# Patient Record
Sex: Male | Born: 1960 | Race: White | Hispanic: No | Marital: Married | Smoking: Never smoker
Health system: Southern US, Community
[De-identification: ages and names within clinical notes are randomized; demographics above are authoritative.]

## PROBLEM LIST (undated history)

## (undated) HISTORY — PX: TOTAL HIP ARTHROPLASTY: SHX124

---
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
## 2000-07-10 HISTORY — PX: ROTATOR CUFF REPAIR: SHX139

## 2016-12-13 ENCOUNTER — Encounter: Payer: Self-pay | Admitting: Family Medicine

## 2016-12-13 ENCOUNTER — Ambulatory Visit (INDEPENDENT_AMBULATORY_CARE_PROVIDER_SITE_OTHER): Payer: Managed Care, Other (non HMO) | Admitting: Family Medicine

## 2016-12-13 VITALS — BP 119/54 | HR 54 | Ht 67.32 in | Wt 203.0 lb

## 2016-12-13 DIAGNOSIS — M160 Bilateral primary osteoarthritis of hip: Secondary | ICD-10-CM

## 2016-12-13 DIAGNOSIS — Z0000 Encounter for general adult medical examination without abnormal findings: Secondary | ICD-10-CM | POA: Diagnosis not present

## 2016-12-13 LAB — COMPLETE METABOLIC PANEL WITH GFR
ALBUMIN: 4.5 g/dL (ref 3.6–5.1)
ALK PHOS: 72 U/L (ref 40–115)
ALT: 22 U/L (ref 9–46)
AST: 20 U/L (ref 10–35)
BUN: 16 mg/dL (ref 7–25)
CO2: 26 mmol/L (ref 20–31)
Calcium: 9.2 mg/dL (ref 8.6–10.3)
Chloride: 106 mmol/L (ref 98–110)
Creat: 1 mg/dL (ref 0.70–1.33)
GFR, Est African American: >89 mL/min (ref >=60)
GFR, Est Non African American: 83 mL/min (ref >=60)
GLUCOSE: 92 mg/dL (ref 65–99)
POTASSIUM: 4.6 mmol/L (ref 3.5–5.3)
SODIUM: 140 mmol/L (ref 135–146)
TOTAL PROTEIN: 6.6 g/dL (ref 6.1–8.1)
Total Bilirubin: 0.9 mg/dL (ref 0.2–1.2)

## 2016-12-13 LAB — LIPID PANEL W/REFLEX DIRECT LDL
CHOL/HDL RATIO: 3.6 ratio (ref <5.0)
Cholesterol: 204 mg/dL — ABNORMAL HIGH (ref <200)
HDL: 57 mg/dL (ref >40)
LDL-CHOLESTEROL: 127 mg/dL — AB
Non-HDL Cholesterol (Calc): 147 mg/dL — ABNORMAL HIGH (ref <130)
Triglycerides: 96 mg/dL (ref <150)

## 2016-12-13 NOTE — Patient Instructions (Signed)
Keep up a regular exercise program and make sure you are eating a healthy diet Try to eat 4 servings of dairy a day, or if you are lactose intolerant take a calcium with vitamin D daily.  Your vaccines are up to date.   

## 2016-12-13 NOTE — Progress Notes (Addendum)
Subjective:    Patient ID: Patrick Young, male    DOB: 1960/07/16, 56 y.o.   MRN: [id number]  HPI 56 year old male comes in today to establish care. I see his wife. His was previously being followed at [facility] for approximately the last 20 years. His physician recently is no longer practicing primary care. He does have a couple of concerns. He has bilateral hip osteoarthritis. 10 years ago they had recommended possible hip replacement had encouraged him to push it off until he really needed it. He would like to get an update on where he is at in that regards. He's now having pain that Radiates down into his upper thighs.   Review of Systems  Constitutional: Negative for diaphoresis, fever and unexpected weight change.  HENT: Negative for hearing loss, rhinorrhea, sneezing and tinnitus.   Eyes: Negative for visual disturbance.  Respiratory: Negative for cough and wheezing.   Cardiovascular: Negative for chest pain and palpitations.  Gastrointestinal: Negative for blood in stool, diarrhea, nausea and vomiting.  Genitourinary: Negative for discharge and dysuria.  Musculoskeletal: Negative for arthralgias and myalgias.  Skin: Negative for rash.  Neurological: Negative for headaches.  Hematological: Negative for adenopathy.  Psychiatric/Behavioral: Negative for dysphoric mood and sleep disturbance. The patient is not nervous/anxious.     BP (!) 119/54   Pulse (!) 54   Ht 5' 7.32" (1.71 m)   Wt 203 lb (92.1 kg)   SpO2 100%   BMI 31.49 kg/m     Allergies  Allergen Reactions  . Penicillins     No past medical history on file.  Past Surgical History:  Procedure Laterality Date  . KNEE ARTHROSCOPY Left 1977  . KNEE ARTHROSCOPY WITH ANTERIOR CRUCIATE LIGAMENT (ACL) REPAIR Left 1980  . ROTATOR CUFF REPAIR Right 2002    Social History   Social History  . Marital status: Married    Spouse name: Gavin PoundDeborah  . Number of children: 2  . Years of education: MBA    Occupational History  . IT     [employer]    Social History Main Topics  . Smoking status: Never Smoker  . Smokeless tobacco: Never Used  . Alcohol use No  . Drug use: No  . Sexual activity: Yes    Partners: Female   Other Topics Concern  . Not on file   Social History Narrative   2 caffeine drinks daily. Works out doing [employer] Companyweights and cardio for 60 minutes 4 times a week.    Family History  Problem Relation Age of Onset  . Hypertension Mother   . Prostate cancer Father        late 1460s  . Lung cancer Father        early 3260s    No outpatient encounter prescriptions on file as of 12/13/2016.   No facility-administered encounter medications on file as of 12/13/2016.          Objective:   Physical Exam  Constitutional: He is oriented to person, place, and time. He appears well-developed and well-nourished.  HENT:  Head: Normocephalic and atraumatic.  Right Ear: External ear normal.  Left Ear: External ear normal.  Nose: Nose normal.  Mouth/Throat: Oropharynx is clear and moist.  Eyes: Conjunctivae and EOM are normal. Pupils are equal, round, and reactive to light.  Neck: Normal range of motion. Neck supple. No thyromegaly present.  Cardiovascular: Normal rate, regular rhythm, normal heart sounds and intact distal pulses.   Pulmonary/Chest: Effort normal and breath sounds  normal.  Abdominal: Soft. Bowel sounds are normal. He exhibits no distension and no mass. There is no tenderness. There is no rebound and no guarding.  Musculoskeletal: Normal range of motion.  Lymphadenopathy:    He has no cervical adenopathy.  Neurological: He is alert and oriented to person, place, and time. He has normal reflexes.  Skin: Skin is warm and dry.  Psychiatric: He has a normal mood and affect. His behavior is normal. Judgment and thought content normal.       Assessment & Plan:  CPE Keep up a regular exercise program and make sure you are eating a healthy diet Try to eat 4  servings of dairy a day, or if you are lactose intolerant take a calcium with vitamin D daily.  Your vaccines are up to date.  Discussion vaccine. Handout given. We'll look through her old records to find his last tetanus vaccine. Had [facility] performed at [facility]. Will call to get report.  Recommend see one of her sports medicine providers for his bilateral hip osteoarthritis. Person to make the appointment on the way out today.  Agree to hep C screening

## 2016-12-14 LAB — CBC
HCT: 42.1 % (ref 38.5–50.0)
HEMOGLOBIN: 14 g/dL (ref 13.2–17.1)
MCH: 32.7 pg (ref 27.0–33.0)
MCHC: 33.3 g/dL (ref 32.0–36.0)
MCV: 98.4 fL (ref 80.0–100.0)
MPV: 10 fL (ref 7.5–12.5)
PLATELETS: 216 10*3/uL (ref 140–400)
RBC: 4.28 MIL/uL (ref 4.20–5.80)
RDW: 12.8 % (ref 11.0–15.0)
WBC: 6.5 10*3/uL (ref 3.8–10.8)

## 2016-12-14 LAB — PSA: PSA: 1.6 ng/mL (ref <=4.0)

## 2016-12-14 LAB — HEPATITIS C ANTIBODY: HCV Ab: NEGATIVE

## 2016-12-18 ENCOUNTER — Ambulatory Visit (INDEPENDENT_AMBULATORY_CARE_PROVIDER_SITE_OTHER): Payer: Managed Care, Other (non HMO) | Admitting: Family Medicine

## 2016-12-18 ENCOUNTER — Encounter: Payer: Self-pay | Admitting: Family Medicine

## 2016-12-18 ENCOUNTER — Ambulatory Visit (INDEPENDENT_AMBULATORY_CARE_PROVIDER_SITE_OTHER): Payer: Managed Care, Other (non HMO)

## 2016-12-18 DIAGNOSIS — M545 Low back pain, unspecified: Secondary | ICD-10-CM | POA: Insufficient documentation

## 2016-12-18 DIAGNOSIS — M25551 Pain in right hip: Secondary | ICD-10-CM

## 2016-12-18 DIAGNOSIS — M2170 Unequal limb length (acquired), unspecified site: Secondary | ICD-10-CM | POA: Diagnosis not present

## 2016-12-18 DIAGNOSIS — M25552 Pain in left hip: Secondary | ICD-10-CM | POA: Diagnosis not present

## 2016-12-18 DIAGNOSIS — G8929 Other chronic pain: Secondary | ICD-10-CM

## 2016-12-18 DIAGNOSIS — M5116 Intervertebral disc disorders with radiculopathy, lumbar region: Secondary | ICD-10-CM | POA: Diagnosis not present

## 2016-12-18 DIAGNOSIS — M4856XA Collapsed vertebra, not elsewhere classified, lumbar region, initial encounter for fracture: Secondary | ICD-10-CM

## 2016-12-18 MED ORDER — OMEPRAZOLE 20 MG PO CPDR: 20.0000 mg | DELAYED_RELEASE_CAPSULE | Freq: Every day | ORAL | 3 refills | Status: DC

## 2016-12-18 NOTE — Patient Instructions (Signed)
Thank you for coming in today. Take aleve up to 2 pills twice daily for pain.  You can take this with tylenol.  Take omeprazole with it for stomach acid protection.  Reschedule for orthotics as needed.  Use a lift in your right shoe.  Work on core strength exercises like pilaties or back yoga.  We can do PT as needed. Let me know.   We can do a shot at any time.   Return as needed.    Preparing for Hip Replacement Recovery from hip replacement surgery can be made easier and more comfortable by being prepared before surgery. This includes:  Arranging for others to help you.  Preparing your home.  Preparing your body by having a preoperative exam and being as healthy as you can.  Doing exercises before your surgery as directed by your health care provider.  You can ease any concerns about your financial responsibilities by calling your insurance company after you decide to have surgery. In addition to asking about your surgery and hospital stay, you will want to ask about coverage for medical equipment, rehabilitation facilities, and home care. How should I arrange for help? You will be stronger and more mobile every day. However, in the first couple weeks after surgery, it is unlikely you will be able to do all your daily activities as easily as before your surgery. You may tire easily and will still have limited movement in your leg. Follow these guidelines to best arrange for the help you may need after your surgery:  Plan to have someone take you home after the procedure. Your health care provider will be able to tell you how many days you can expect to be in the hospital.  Cancel all work, caregiving, and volunteer responsibilities for at least 4-6 weeks after surgery.  If you live alone, arrange for someone to care for your home and pets for the first 4-6 weeks after surgery.  Select someone with whom you feel comfortable to be with you day and night for the first week. This  person will help you with your exercises and personal care, such as bathing and using the toilet.  Arrange for drivers to bring you to and from your follow-up appointments, the grocery store, and other places you may need to go for at least 4-6 weeks.  How should I prepare my home?  Pick a recovery spot, but do not plan on recovering in bed. Sitting in a more upright position is better for your health. You may want to use a recliner with a small table nearby. Choose a chair with a firm seat that will not allow you to sink down into it. Chairs and sofas that are too soft can allow your hip to bend at an angle greater than 90 degrees. This could put you at risk for dislocating your new hip joint. Place the items you use most frequently on the small table. These may include the TV remote, a cordless phone, a book or laptop computer, a water glass, and any other items of your choice.  Remove all clutter from your floors. Also remove any throw rugs.  To see if you will be able to move in your home with a wheeled walker, hold your hands out about 6 inches (15 cm) from your sides. Walk from your recovery spot to your kitchen and bathroom. Then walk from your bed to the bathroom. If you do not hit anything with your hands, you have enough room.  Move the items you use most often in your kitchen, bathroom, and bedroom to shelves and drawers that are at countertop height.  Prepare a few meals to freeze and reheat later.  Consider adding grab bars in the shower and near the toilet.  While you are in the hospital, you will learn about equipment that can be helpful for your recovery. Some of the equipment includes raised toilet seats, tub benches, and shower benches. How should I prepare my body?  Have a preoperative exam. This will ensure that your body is healthy enough to safely have this surgery. Bring a complete list of all your medicines and supplements, including herbs and vitamins. You may need to  have additional tests to ensure your safety.  Have elective dental care and routine cleanings before your surgery. Germs from anywhere in your body, including your mouth, can travel to your new joint and infect it. It is important not to have any dental work performed for at least 3 months after your surgery. After surgery, be sure to tell your dentist about your joint replacement.  Maintain a healthy diet. Do not change your diet before surgery unless advised to do so by your health care provider.  Do not use any tobacco products, including cigarettes, chewing tobacco, or electronic cigarettes. If you need help quitting, ask your health care provider. Tobacco and nicotine products can delay healing after your surgery.  The day before your surgery, follow your health care provider's directions for showering, eating, drinking, and taking medicines. These directions are for your safety. What kinds of exercises should I do? Your health care provider may have you do the following exercises before your surgery. Be sure to follow the exercise program only as directed by your health care provider. While completing these exercises, remember to stretch for as long as you can, up to 30 seconds. You should only feel a gentle lengthening or release in the stretched tissue. You should not feel pain. Ankle Pumps 1. While sitting on a firm surface with your legs straight out in front of you, move your feet at the ankle joints so that your toes are pulling back toward your chest. 2. Reverse the motion, pointing your toes away from you. 3. Repeat 10-20 times. Complete this exercise 1-2 times per day.  Heel Slides 1. Lie on your back with both knees straight. (If this causes back pain, bend one knee, placing your foot flat on the floor. Keep this leg in this position while doing heel slides with the opposite leg.) 2. Slowly slide one heel back toward your buttocks until you feel a gentle stretch in the front of your  knee or thigh. 3. Slowly slide your heel back to the starting position. 4. Repeat 10-20 times, then switch heels and do it again. Complete this exercise 1-2 times per day.  Quadriceps Sets 1. Lie on your back with one leg extended and your opposite knee bent. 2. Gradually tighten the muscles in the front of the thigh of your extended leg. This motion will push the back of the knee down toward the floor. 3. Hold the muscle as tightly as you can without causing pain for 10 seconds. 4. Relax the muscles slowly and completely in between each repetition. 5. Repeat 10-20 times, then switch legs and do it again. Complete this exercise 1-2 times per day.  Short Arc Kicks 1. Lie on your back. Place a rolled towel (4-6 inches [10-15 cm] high) under one knee so that the  knee slightly bends. 2. Raise only the lower leg of your slightly bent leg by tightening the muscles in the front of your thigh. Do not allow your thigh to rise. 3. Hold this position for 5 seconds. 4. Repeat 10-20 times, then switch legs and do it again. Complete this exercise 1-2 times per day.  Straight Leg Raises 1. Lie on your back with one leg extended and your opposite knee bent. 2. Tighten the muscles in the front of the thigh of your extended leg. Your thigh may shake slightly. 3. Tighten these muscles even more and raise your leg 4-6 inches off the floor. Hold for 3-5 seconds. 4. Keeping these muscles tight, lower your leg. 5. Relax the muscles slowly and completely in between each repetition. 6. Repeat 10-20 times, then switch legs and do it again. Complete this exercise 1-2 times per day.  Arm Chair Push-ups 1. Find a firm, non-wheeled chair with solid armrests. 2. Sitting in the chair, extend one leg straight out in front of you. 3. Lift up your body weight, using your arms and opposite leg. 4. Slowly lower your body weight. 5. Repeat 10-20 times, then switch legs and do it again. Complete this exercise 1-2 times per  day.  This information is not intended to replace advice given to you by your health care provider. Make sure you discuss any questions you have with your health care provider. Document Released: 09/30/2010 Document Revised: 11/29/2015 Document Reviewed: 09/18/2013 Elsevier Interactive Patient Education  2017 [employer].

## 2016-12-18 NOTE — Progress Notes (Signed)
Subjective:    I'm seeing this patient as a consultation for:  Dr. Linford Arnold  CC: chronic hip pain  HPI: Mr. Corigliano is a 56 yo man who presents today for evaluation of bilateral osteoarthritis in his hips. He reports a 10 year history of progressing dull anterior hip pain radiates down his proximal anterior thigh.  He finds it improves with aleve and using an inverter, worsens with prolonged standing.  It is accompanied by a feeling of catching in his lower back for the last couple years when moving from flexion to extension. The pain is manageable now and he was told he may need a hip replacement several years ago so he would like to keep an eye on it. He tries to stay active by working out 3-4 times a week with an elliptical and weights.  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 He denies trauma, paraesthesias, bowel or bladder symptoms.  Past medical history, Surgical history, Family history not pertinant except as noted below, Social history, Allergies, and medications have been entered into the medical record, reviewed, and no changes needed.   Review of Systems: No headache, visual changes, nausea, vomiting, diarrhea, constipation, dizziness, abdominal pain, skin rash, fevers, chills, night sweats, weight loss, swollen lymph nodes, body aches, joint swelling, muscle aches, chest pain, shortness of breath, mood changes, visual or auditory hallucinations.   Objective:    Vitals:   12/18/16 0831  BP: (!) 125/58  Pulse: (!) 49   General: Pleasant, fit-appearing middle aged man, sitting comfortably on exam table Neuro/Psych: Alert and oriented x3, extra-ocular muscles intact, able to move all 4 extremities, sensation grossly intact. Skin: Warm and dry, no rashes noted.  Respiratory: Not using accessory muscles, speaking in full sentences, trachea midline.  Cardiovascular: Pulses palpable, no extremity edema. Abdomen:  Does not appear distended. MSK:  1 cm leg length difference, right shorter than left Normal lumbar spine ROM, no tenderness to palpitation Hip: Bilateral hip flexion limited to 120 degrees 5 degrees of internal rotation 15 degrees of external rotation Normal strength.  Normal gait.    No results found for this or any previous visit (from the past 24 hour(s)). Dg Lumbar Spine 2-3 Views  Result Date: 12/18/2016 CLINICAL DATA:  Low back and bilateral hip pain for several years with numbness and tingling radiating to the right foot. EXAM: LUMBAR SPINE - 2-3 VIEW COMPARISON:  None. FINDINGS: There are 5 non rib-bearing lumbar type vertebral bodies. Age-indeterminate mild (approximately 25%) compression deformity involving the superior endplate of the L1 vertebral body. Remaining lumbar vertebral body heights appear preserved Mild to moderate multilevel lumbar spine DDD, worse at L4-L5 with disc space height loss, endplate irregularity and sclerosis. Limited visualization of the bilateral SI joints is normal Regional bowel gas pattern is normal. IMPRESSION: 1. Age-indeterminate mild (approximately 25%) L1 compression deformity. Correlation for point tenderness at this location is recommended. 2. Mild-to-moderate multilevel lumbar spine DDD, worse at L4-L5. Electronically Signed   By: Simonne Come M.D.   On: 12/18/2016 09:28   Dg Hips Bilat With Pelvis 2v  Result Date: 12/18/2016 CLINICAL DATA:  C/o lower back pain with bilateral hip pain x several years with numbness and tingling in RT foot. EXAM: DG HIP (WITH OR WITHOUT PELVIS) 2V BILAT COMPARISON:  None. FINDINGS: No fracture or dislocation. Moderate to severe degenerative change of the bilateral hips with joint space loss, subchondral sclerosis and  osteophytosis. No evidence avascular necrosis. Limited visualization of the pelvis is normal. Regional soft tissues appear normal. IMPRESSION: Moderate to severe degenerative change of the bilateral hips,  right subjectively greater than left. Electronically Signed   By: Simonne ComeJohn  Watts M.D.   On: 12/18/2016 09:29    Impression and Recommendations:    Assessment and Plan: 56 y.o. male with bilateral hip OA. Patient was encouraged to continue exercise and maintaining good overall health. Increase aleve to 2 pill po bid. Will also presribe omeprazole for gastric protection, add tylenol. Will try 7/16 inch heel lift in right shoe to correct leg length differences. Patient will consider orthotic inserts for future visit for lift and to correct suppination. I don't think hip replacement is necessary yet, patient is managing symptoms well currently but might be considered with further progression of hip pain.    Orders Placed This Encounter  Procedures  . DG HIPS BILAT WITH PELVIS 2V    Standing Status:   Future    Number of Occurrences:   1    Standing Expiration Date:   02/17/2018    Order Specific Question:   Reason for Exam (SYMPTOM  OR DIAGNOSIS REQUIRED)    Answer:   eval bl hip pain suspect oa    Order Specific Question:   Preferred imaging location?    Answer[facility]   [facility]    Order Specific Question:   Radiology Contrast Protocol - do NOT remove file path    Answer:   \\[email or web address]  . DG Lumbar Spine 2-3 Views    Standing Status:   Future    Number of Occurrences:   1    Standing Expiration Date:   02/17/2018    Order Specific Question:   Reason for Exam (SYMPTOM  OR DIAGNOSIS REQUIRED)    Answer:   eval lumbago suspect ddd. no trauma    Order Specific Question:   Preferred imaging location?    Answer:   [facility]    Order Specific Question:   Radiology Contrast Protocol - do NOT remove file path    Answer:   \\[email or web address]   Meds ordered this encounter  Medications  . Naproxen Sodium (ALEVE PO)    Sig: Take by mouth.  Marland Kitchen. omeprazole (PRILOSEC) 20 MG capsule    Sig: Take 1  capsule (20 mg total) by mouth daily.    Dispense:  90 capsule    Refill:  3    Discussed warning signs or symptoms. Please see discharge instructions. Patient expresses understanding.

## 2017-11-06 ENCOUNTER — Ambulatory Visit (INDEPENDENT_AMBULATORY_CARE_PROVIDER_SITE_OTHER): Payer: Managed Care, Other (non HMO) | Admitting: Family Medicine

## 2017-11-06 ENCOUNTER — Encounter: Payer: Self-pay | Admitting: Family Medicine

## 2017-11-06 VITALS — BP 123/59 | HR 62 | Ht 72.0 in | Wt 208.0 lb

## 2017-11-06 DIAGNOSIS — R300 Dysuria: Secondary | ICD-10-CM | POA: Diagnosis not present

## 2017-11-06 LAB — POCT URINALYSIS DIPSTICK
BILIRUBIN UA: NEGATIVE
GLUCOSE UA: NEGATIVE
KETONES UA: NEGATIVE
Leukocytes, UA: NEGATIVE
Nitrite, UA: NEGATIVE
Protein, UA: NEGATIVE
RBC UA: NEGATIVE
Spec Grav, UA: 1.02 (ref 1.010–1.025)
Urobilinogen, UA: 0.2 E.U./dL
pH, UA: 7 (ref 5.0–8.0)

## 2017-11-06 MED ORDER — CEFDINIR 300 MG PO CAPS: 300.0000 mg | ORAL_CAPSULE | Freq: Two times a day (BID) | ORAL | 0 refills | Status: DC

## 2017-11-06 NOTE — Progress Notes (Signed)
Patrick Young is a 57 y.o. male who presents to [facility]: Primary Care Sports Medicine today for burning with urination.  Patrick Young notes a one-week history of mild central pelvic pain and burning with urination.  He notes a week ago he developed right lateral flank pain consistent with kidney stone.  This lasted for a few hours and then resolved spontaneously.  He denies any blood in the urine.  He notes following the resolution of his pain he has some mild burning with urinary associated with mild central pelvic pressure.  He denies fevers chills nausea vomiting or diarrhea has not tried any medications yet.  He feels pretty well otherwise.  He denies any penile discharge or testicle pain.   No past medical history on file. Past Surgical History:  Procedure Laterality Date  . KNEE ARTHROSCOPY Left 1977  . KNEE ARTHROSCOPY WITH ANTERIOR CRUCIATE LIGAMENT (ACL) REPAIR Left 1980  . ROTATOR CUFF REPAIR Right 2002   Social History   Tobacco Use  . Smoking status: Never Smoker  . Smokeless tobacco: Never Used  Substance Use Topics  . Alcohol use: No   family history includes Hypertension in his mother; Lung cancer in his father; Prostate cancer in his father.  ROS as above:  Medications: Current Outpatient Medications  Medication Sig Dispense Refill  . Naproxen Sodium (ALEVE PO) Take by mouth.    Marland Kitchen omeprazole (PRILOSEC) 20 MG capsule Take 1 capsule (20 mg total) by mouth daily. 90 capsule 3  . cefdinir (OMNICEF) 300 MG capsule Take 1 capsule (300 mg total) by mouth 2 (two) times daily. 14 capsule 0   No current facility-administered medications for this visit.    Allergies  Allergen Reactions  . Penicillins Rash    As child    Health Maintenance Health Maintenance  Topic Date Due  . HIV Screening  08/28/1974  . COLONOSCOPY  08/28/2009  . INFLUENZA VACCINE  02/07/2018  .  TETANUS/TDAP  07/10/2021  . Hepatitis C Screening  Completed     Exam:  BP (!) 123/59   Pulse 62   Ht 6' (1.829 m)   Wt 208 lb (94.3 kg)   BMI 28.21 kg/m  Gen: Well NAD HEENT: EOMI,  MMM Lungs: Normal work of breathing. CTABL Heart: RRR no MRG Abd: NABS, Soft. Nondistended, Nontender no masses palpated.  No CVA angle tenderness to percussion Exts: Brisk capillary refill, warm and well perfused.  Genital exam no inguinal lymphadenopathy.  Testicles are nontender with no masses bilaterally.  No inguinal hernia present.  Penis is normal-appearing without discharge or lesions. Rectal exam normal-appearing anus.  Prostate is small and nontender with no nodules or masses.   Results for orders placed or performed in visit on 11/06/17 (from the past 72 hour(s))  POCT Urinalysis Dipstick     Status: None   Collection Time: 11/06/17 10:22 AM  Result Value Ref Range   Color, UA yellow    Clarity, UA clear    Glucose, UA negative    Bilirubin, UA negative    Ketones, UA negative    Spec Grav, UA 1.020 1.010 - 1.025   Blood, UA negative    pH, UA 7.0 5.0 - 8.0   Protein, UA negative    Urobilinogen, UA 0.2 0.2 or 1.0 E.U./dL   Nitrite, UA negative    Leukocytes, UA Negative Negative   Appearance     Odor     No results found.  Assessment and Plan: 58 y.o. male with  Dysuria.  Potential urethritis.  Urine culture GC chlamydia pending.  Discussed treatment options.  Plan for trial of Omnicef.  Would like to avoid fluoroquinolones if possible due to tendinitis potential.  Patient has a rash allergy as a child to penicillins.  Cephalosporins should be safe in this context.  If not better plan to return to clinic.  Next step may be CT scan versus further metabolic work-up.   Orders Placed This Encounter  Procedures  . Urine Culture  . C. trachomatis/N. gonorrhoeae RNA  . POCT Urinalysis Dipstick   Meds ordered this encounter  Medications  . cefdinir (OMNICEF) 300 MG capsule      Sig: Take 1 capsule (300 mg total) by mouth 2 (two) times daily.    Dispense:  14 capsule    Refill:  0     Discussed warning signs or symptoms. Please see discharge instructions. Patient expresses understanding.

## 2017-11-06 NOTE — Patient Instructions (Signed)
Thank you for coming in today. Take omnicef twice daily for 1 week.  Recheck if not better.  We will update you with results when they come back.    Urethritis, Adult Urethritis is an inflammation of the tube through which urine exits your bladder (urethra). What are the causes? Urethritis is often caused by an infection in your urethra. The infection can be viral, like herpes. The infection can also be bacterial, like gonorrhea. What increases the risk? Risk factors of urethritis include:  Having sex without using a condom.  Having multiple sexual partners.  Having poor hygiene.  What are the signs or symptoms? Symptoms of urethritis are less noticeable in women than in men. These symptoms include:  Burning feeling when you urinate (dysuria).  Discharge from your urethra.  Blood in your urine (hematuria).  Urinating more than usual.  How is this diagnosed? To confirm a diagnosis of urethritis, your health care provider will do the following:  Ask about your sexual history.  Perform a physical exam.  Have you provide a sample of your urine for lab testing.  Use a cotton swab to gently collect a sample from your urethra for lab testing.  How is this treated? It is important to treat urethritis. Depending on the cause, untreated urethritis may lead to serious genital infections and possibly infertility. Urethritis caused by a bacterial infection is treated with antibiotic medicine. All sexual partners must be treated. Follow these instructions at home:  Do not have sex until the test results are known and treatment is completed, even if your symptoms go away before you finish treatment.  If you were prescribed an antibiotic, finish it all even if you start to feel better. Contact a health care provider if:  Your symptoms are not improved in 3 days.  Your symptoms are getting worse.  You develop abdominal pain or pelvic pain (in women).  You develop joint  pain.  You have a fever. Get help right away if:  You have severe pain in the belly, back, or side.  You have repeated vomiting. This information is not intended to replace advice given to you by your health care provider. Make sure you discuss any questions you have with your health care provider. Document Released: 12/20/2000 Document Revised: 12/02/2015 Document Reviewed: 02/24/2013 Elsevier Interactive Patient Education  2017 [employer].

## 2017-11-07 LAB — URINE CULTURE
MICRO NUMBER: 90525974
RESULT: NO GROWTH
SPECIMEN QUALITY:: ADEQUATE

## 2017-11-07 LAB — C. TRACHOMATIS/N. GONORRHOEAE RNA
C. TRACHOMATIS RNA, TMA: NOT DETECTED
N. gonorrhoeae RNA, TMA: NOT DETECTED

## 2018-07-10 HISTORY — PX: TOTAL HIP ARTHROPLASTY: SHX124

## 2019-06-09 IMAGING — DX DG HIP (WITH OR WITHOUT PELVIS) 2V BILAT
5 series · 5 of 5 positions shown · non-contrast
Comparison: None.

CLINICAL DATA: C/o lower back pain with bilateral hip pain x
several years with numbness and tingling in RT foot.

EXAM:
DG HIP (WITH OR WITHOUT PELVIS) 2V BILAT

[pelvis ap]
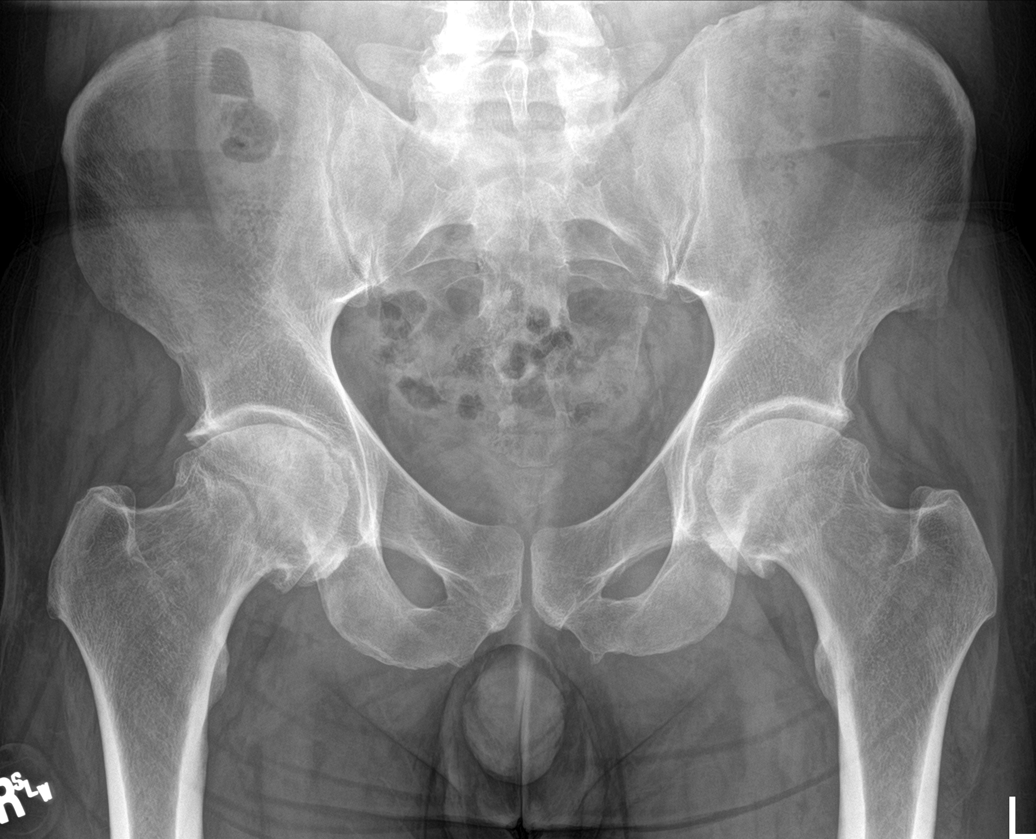

[hip ap (1 of 2)]
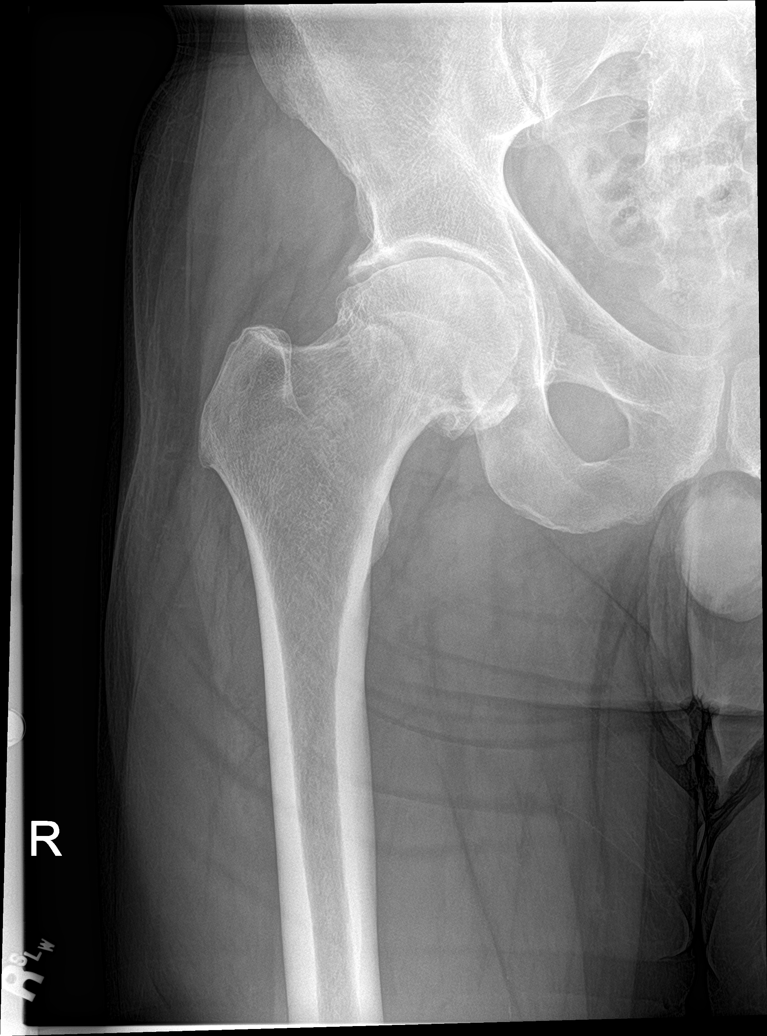

[hip lat (1 of 2)]
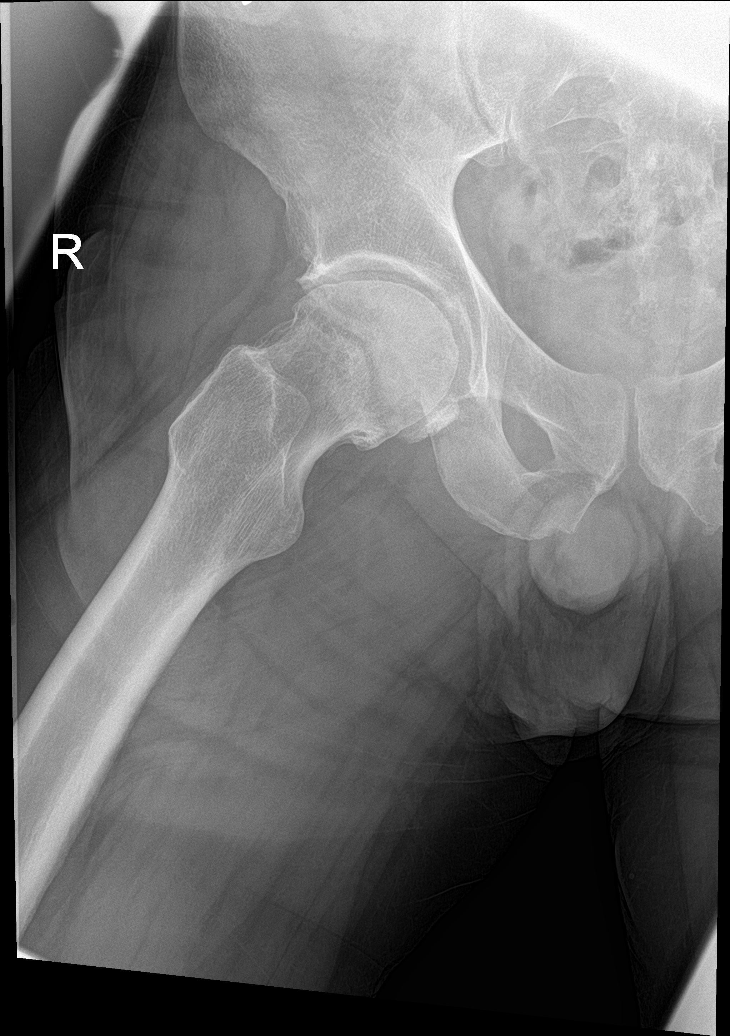

[hip ap (2 of 2)]
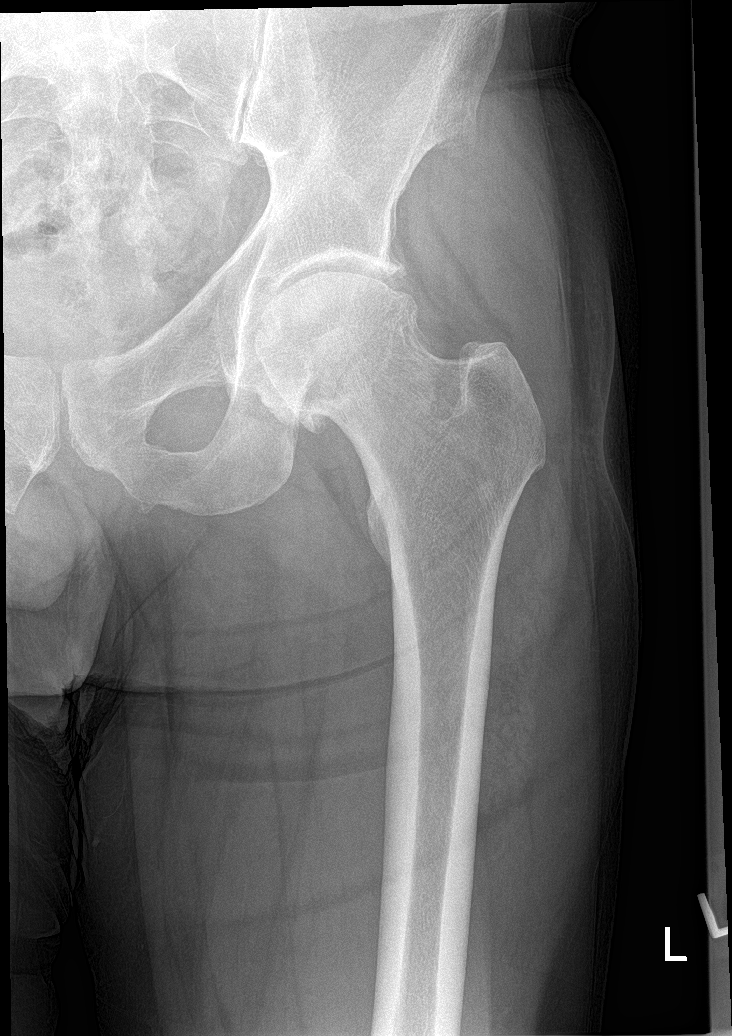

[hip lat (2 of 2)]
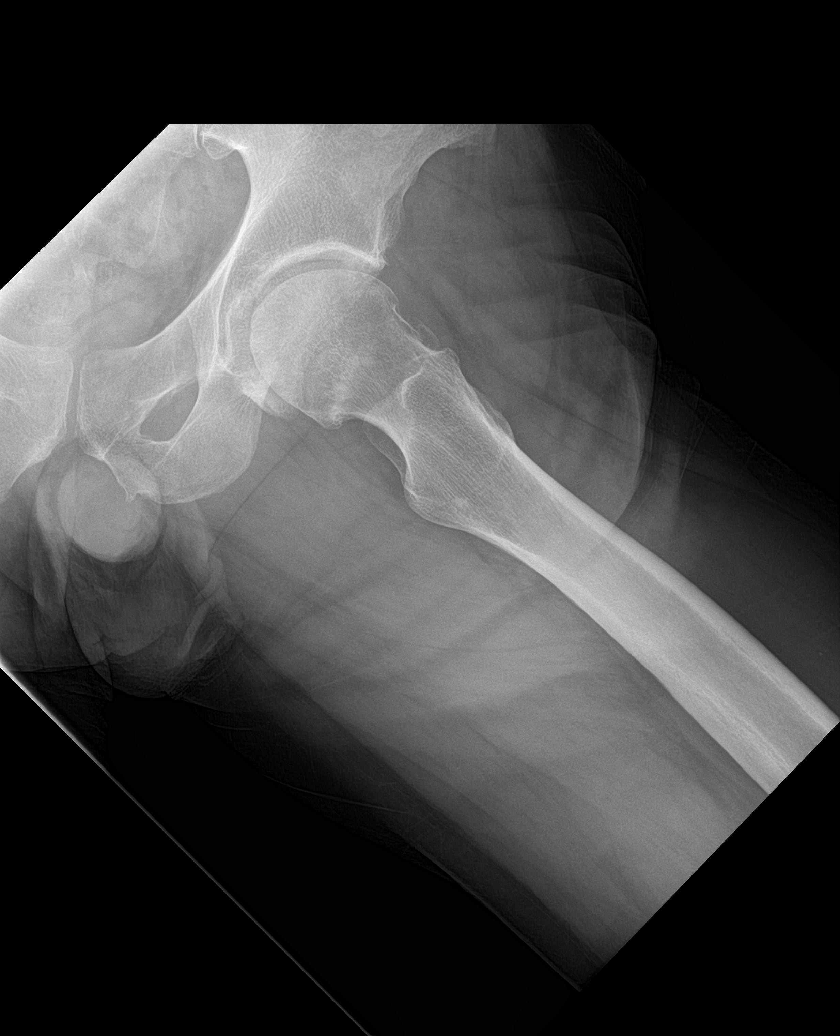

[5 of 5 positions shown; findings below may reference images not displayed]

FINDINGS: No fracture or dislocation. Moderate to severe degenerative change
of the bilateral hips with joint space loss, subchondral sclerosis
and osteophytosis. No evidence avascular necrosis.

Limited visualization of the pelvis is normal. Regional soft tissues
appear normal.
IMPRESSION: Moderate to severe degenerative change of the bilateral hips, right
subjectively greater than left.

## 2021-01-03 ENCOUNTER — Other Ambulatory Visit: Payer: Self-pay

## 2021-01-03 ENCOUNTER — Emergency Department
Admission: EM | Admit: 2021-01-03 | Discharge: 2021-01-03 | Disposition: A | Discharge status: Home / Self Care | Payer: Managed Care, Other (non HMO) | Source: Home / Self Care | Attending: Family Medicine | Admitting: Family Medicine

## 2021-01-03 DIAGNOSIS — H5712 Ocular pain, left eye: Secondary | ICD-10-CM

## 2021-01-03 DIAGNOSIS — T1592XA Foreign body on external eye, part unspecified, left eye, initial encounter: Secondary | ICD-10-CM

## 2021-01-03 NOTE — ED Triage Notes (Signed)
Pt presents to Urgent Care with c/o L eye "irritation" since yesterday. Reports flushing it w/ saline w/o relief. Sclera of L inner eye noted to be pink.

## 2021-01-03 NOTE — Discharge Instructions (Addendum)
Larita Fife from your eye doctor will call you back with your appointment time today

## 2021-01-03 NOTE — ED Provider Notes (Signed)
[name]    CSN: [id number] Arrival date & time: 01/03/21  8032      History   Chief Complaint Chief Complaint  Patient presents with   Eye Problem    Left    HPI Patrick Young is a 60 y.o. male.   HPI  Patient was working on his [place] running his tractor yesterday.  He states that he thinks something flew into his eye.  He developed some irritation.  He rinsed it thoroughly.  He is here because he is worried something might be in his eye.  Vision is normal.  No photophobia.  Slight drainage this morning when he woke up. He states he called his usual eye doctor and they do not have any openings, but they advised him to call back if he was found to have an abnormality at the urgent care center. Patient is in good health.  Retired.  Orthopedic problems but otherwise no chronic medications  History reviewed. No pertinent past medical history.  Patient Active Problem List   Diagnosis Date Noted   Hip pain, bilateral 12/18/2016   Lumbago 12/18/2016   Leg length difference, acquired 12/18/2016    Past Surgical History:  Procedure Laterality Date   KNEE ARTHROSCOPY Left 1977   KNEE ARTHROSCOPY WITH ANTERIOR CRUCIATE LIGAMENT (ACL) REPAIR Left 1980   ROTATOR CUFF REPAIR Right 2002   TOTAL HIP ARTHROPLASTY Bilateral        Home Medications    Prior to Admission medications   Not on File    Family History Family History  Problem Relation Age of Onset   Hypertension Mother    Prostate cancer Father        late 64s   Lung cancer Father        early 74s    Social History Social History   Tobacco Use   Smoking status: Never   Smokeless tobacco: Never  Vaping Use   Vaping Use: Never used  Substance Use Topics   Alcohol use: No   Drug use: No     Allergies   Penicillins   Review of Systems Review of Systems See HPI  Physical Exam Triage Vital Signs ED Triage Vitals  Enc Vitals Group     BP 01/03/21 1007 119/74      Pulse Rate 01/03/21 1007 63     Resp 01/03/21 1007 16     Temp 01/03/21 1007 98.1 F (36.7 C)     Temp Source 01/03/21 1007 Oral     SpO2 01/03/21 1007 98 %     Weight 01/03/21 1003 200 lb (90.7 kg)     Height 01/03/21 1003 6' (1.829 m)     Head Circumference --      Peak Flow --      Pain Score 01/03/21 1003 2     Pain Loc --      Pain Edu? --      Excl. in GC? --    No data found.  Updated Vital Signs BP 119/74 (BP Location: Right Arm)   Pulse 63   Temp 98.1 F (36.7 C) (Oral)   Resp 16   Ht 6' (1.829 m)   Wt 90.7 kg   SpO2 98%   BMI 27.12 kg/m   Visual Acuity Right Eye Distance: 20/30 Left Eye Distance: 20/20 Bilateral Distance: 20/20     Physical Exam Constitutional:      General: He is not in acute distress.    Appearance:  He is well-developed.  HENT:     Head: Normocephalic and atraumatic.  Eyes:     General: Lids are normal. Lids are everted, no foreign bodies appreciated. Vision grossly intact. Gaze aligned appropriately.     Extraocular Movements: Extraocular movements intact.     Conjunctiva/sclera:     Left eye: Left conjunctiva is injected.     Pupils: Pupils are equal, round, and reactive to light.      Comments: Punctate circular fluorescein uptake with clear center suspicious for foreign body  Cardiovascular:     Rate and Rhythm: Normal rate.  Pulmonary:     Effort: Pulmonary effort is normal. No respiratory distress.  Abdominal:     General: There is no distension.     Palpations: Abdomen is soft.  Musculoskeletal:        General: Normal range of motion.     Cervical back: Normal range of motion.  Skin:    General: Skin is warm and dry.  Neurological:     Mental Status: He is alert.     UC Treatments / Results  Labs (all labs ordered are listed, but only abnormal results are displayed) Labs Reviewed - No data to display  EKG   Radiology No results found.  Procedures Procedures (including critical care time)  Medications  Ordered in UC Medications - No data to display  Initial Impression / Assessment and Plan / UC Course  I have reviewed the triage vital signs and the nursing notes.  Pertinent labs & imaging results that were available during my care of the patient were reviewed by me and considered in my medical decision making (see chart for details).     Patient appears to have a foreign body in his eye, I do not have a slit-lamp or appointment for removal.  Will send him back to his usual eye doctor.  I called the office and they will work him in this afternoon Final Clinical Impressions(s) / UC Diagnoses   Final diagnoses:  Left eye pain  Foreign body, eye, left, initial encounter     Discharge Instructions      Larita Fife from your eye doctor will call you back with your appointment time today   ED Prescriptions   None    PDMP not reviewed this encounter.   Eustace Moore, MD 01/03/21 [phone]

## 2022-02-21 LAB — HM COLONOSCOPY

## 2022-04-11 ENCOUNTER — Encounter: Payer: Self-pay | Admitting: Family Medicine

## 2023-12-27 ENCOUNTER — Ambulatory Visit
Admission: EM | Admit: 2023-12-27 | Discharge: 2023-12-27 | Disposition: A | Discharge status: Home / Self Care | Attending: Internal Medicine | Admitting: Internal Medicine

## 2023-12-27 ENCOUNTER — Ambulatory Visit

## 2023-12-27 DIAGNOSIS — G9331 Postviral fatigue syndrome: Secondary | ICD-10-CM | POA: Diagnosis not present

## 2023-12-27 DIAGNOSIS — R059 Cough, unspecified: Secondary | ICD-10-CM | POA: Diagnosis not present

## 2023-12-27 DIAGNOSIS — R5383 Other fatigue: Secondary | ICD-10-CM

## 2023-12-27 DIAGNOSIS — R0602 Shortness of breath: Secondary | ICD-10-CM

## 2023-12-27 DIAGNOSIS — J189 Pneumonia, unspecified organism: Secondary | ICD-10-CM

## 2023-12-27 LAB — POCT URINALYSIS DIP (MANUAL ENTRY)
Bilirubin, UA: NEGATIVE
Glucose, UA: NEGATIVE mg/dL
Ketones, POC UA: NEGATIVE mg/dL
Leukocytes, UA: NEGATIVE
Nitrite, UA: NEGATIVE
Protein Ur, POC: 100 mg/dL — AB
Spec Grav, UA: 1.015 (ref 1.010–1.025)
Urobilinogen, UA: 0.2 U/dL
pH, UA: 6 (ref 5.0–8.0)

## 2023-12-27 MED ORDER — AZITHROMYCIN 250 MG PO TABS: 500.0000 mg | ORAL_TABLET | Freq: Every day | ORAL | 0 refills | Status: AC

## 2023-12-27 MED ORDER — PREDNISONE 20 MG PO TABS: 40.0000 mg | ORAL_TABLET | Freq: Every day | ORAL | 0 refills | Status: AC

## 2023-12-27 NOTE — Discharge Instructions (Addendum)
 X-ray done today of the chest.  This did show a right lower lobe pneumonia.  This is consistent with the persistent symptoms.  Urinalysis done today did not show an infectious process.  We have also sent off a complete blood count as well as a complete metabolic panel.  These results will take 24 to 48 hours.  If any of these results need further treatment, we will contact you otherwise the results will be available on your MyChart.  For the pneumonia, we will treat with the following: Azithromycin 500 mg (2 tablets) daily for 5 days.  This is an antibiotic.  Take this with food. Prednisone 40 mg (2 tablets) once daily for 5 days. Take this in the morning.  This is a steroid to help with inflammation and pain.  Rest when needed and stay hydrated. Per radiologist recommendation, return in 3 to 4 weeks for a repeat chest x-ray to ensure that this is resolving. Return to urgent care if you feel symptoms are not resolving or feel that symptoms are worsening.

## 2023-12-27 NOTE — ED Triage Notes (Signed)
 Pt presents to [facility] with co uri about 4 weeks ago that he thinks was covid. Pt reports he was doing better and over the last week he started having fevers again requiring constant motrin and tylenol. Pt also co of fatigue.

## 2023-12-27 NOTE — ED Provider Notes (Signed)
 [facility]    CSN: [id number] Arrival date & time: 12/27/23  0808      History   Chief Complaint Chief Complaint  Patient presents with   Fever   Fatigue    HPI Patrick Young is a 63 y.o. male.   63 year old male who presents urgent care with complaints of fever and fatigue.  The patient reports that approximately 3 to 4 weeks ago he had an upper respiratory infection.  He has a family member who is a medical resident and advised that he probably had COVID and that he should just treat symptomatically.  The patient did this and did get better initially but then developed cough, fatigue and recurrent fevers.  He has been dealing with this since then.  He has been taking Tylenol and ibuprofen to control the fevers.  He reports that he is getting short of breath and has some tightness in his chest when he is doing activity.  He denies nausea, vomiting, abdominal pain, sore throat, ear pain, congestion, dysuria, hematuria.  He does relate that his urine has been very dark despite drinking plenty of water.  His appetite has been less than normal.  He has gone to another urgent care for the symptoms but no workup was done and he was diagnosed with postviral syndrome.  He normally is very active and has not been able to maintain and does not take any medications regularly.  He does not have any significant past medical history.  He denies any cardiac history, asthma, COPD, tobacco use.  He is very frustrated as he is normally a very active person and feels that he is not able to maintain his level of fitness.   Fever Associated symptoms: no chest pain, no chills, no cough, no dysuria, no ear pain, no rash, no sore throat and no vomiting     History reviewed. No pertinent past medical history.  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 Authorizing Provider  azithromycin (ZITHROMAX) 250 MG tablet Take 2 tablets (500 mg total) by mouth daily for 5 days. Take first 2 tablets together, then 1 every day until finished. 12/27/23 01/01/24 Yes Paysley Poplar A, PA-C  predniSONE (DELTASONE) 20 MG tablet Take 2 tablets (40 mg total) by mouth daily with breakfast for 5 days. 12/27/23 01/01/24 Yes Kreg Pesa, PA-C    Family History Family History  Problem Relation Age of Onset   Hypertension Mother    Prostate cancer Father        late 38s   Lung cancer Father        early 64s    Social History Social History   Tobacco Use   Smoking status: Never   Smokeless tobacco: Never  Vaping Use   Vaping status: Never Used  Substance Use Topics   Alcohol use: No   Drug use: No     Allergies   Penicillins   Review of Systems Review of Systems  Constitutional:  Positive for appetite change, fatigue and fever. Negative for chills.  HENT:  Negative for ear pain and sore throat.   Eyes:  Negative for pain and visual disturbance.  Respiratory:  Positive for shortness of breath. Negative for cough.   Cardiovascular:  Negative for chest pain and palpitations.  Gastrointestinal:  Negative for abdominal pain and vomiting.  Genitourinary:  Negative for dysuria and hematuria.  Musculoskeletal:  Negative for arthralgias and back pain.  Skin:  Negative for color change and rash.  Neurological:  Negative for seizures and syncope.  All other systems reviewed and are negative.    Physical Exam Triage Vital Signs ED Triage Vitals  Encounter Vitals Group     BP 12/27/23 0825 101/68      Girls Systolic BP Percentile --      Girls Diastolic BP Percentile --      Boys Systolic BP Percentile --      Boys Diastolic BP Percentile --      Pulse Rate 12/27/23 0825 75     Resp 12/27/23 0825 19     Temp 12/27/23 0825 98.9 F (37.2 C)     Temp src --      SpO2 12/27/23 0825 98 %     Weight --      Height --      Head Circumference --      Peak Flow --      Pain Score 12/27/23 0824 0     Pain Loc --      Pain Education --      Exclude from Growth Chart --    No data found.  Updated Vital Signs BP 101/68   Pulse 75   Temp 98.9 F (37.2 C)   Resp 19   SpO2 98%   Visual Acuity Right Eye Distance:   Left Eye Distance:   Bilateral Distance:    Right Eye Near:   Left Eye Near:    Bilateral Near:     Physical Exam Vitals and nursing note reviewed.  Constitutional:      General: He is not in acute distress.    Appearance: He is well-developed.  HENT:     Head: Normocephalic and atraumatic.   Eyes:     Conjunctiva/sclera: Conjunctivae normal.    Cardiovascular:     Rate and Rhythm: Normal rate and regular rhythm.     Heart sounds: No murmur heard. Pulmonary:     Effort: Pulmonary effort is normal. Tachypnea present. No accessory muscle usage or respiratory distress.     Breath sounds: Normal breath sounds. No wheezing or rhonchi.     Comments: Lung sounds are somewhat distant Abdominal:     Palpations: Abdomen is soft.     Tenderness: There is no abdominal tenderness.   Musculoskeletal:        General: No swelling.     Cervical back: Neck supple.   Skin:    General: Skin is warm and dry.     Capillary Refill: Capillary refill takes less than 2 seconds.   Neurological:     Mental Status: He is alert.   Psychiatric:        Mood and Affect: Mood normal.      UC Treatments / Results  Labs (all labs ordered are listed, but only abnormal results are displayed) Labs Reviewed  POCT URINALYSIS DIP (MANUAL ENTRY) - Abnormal; Notable for the  following components:      Result Value   Blood, UA  trace-intact (*)    Protein Ur, POC =100 (*)    All other components within normal limits  COMPREHENSIVE METABOLIC PANEL WITH GFR  CBC WITH DIFFERENTIAL/PLATELET    EKG   Radiology DG Chest 2 View Result Date: 12/27/2023 CLINICAL DATA:  Cough.  Fatigue. EXAM: CHEST - 2 VIEW COMPARISON:  None Available. FINDINGS: The heart size and mediastinal contours are within normal limits. Streaky opacities at the lateral right lung base. The left lung appears clear. No pleural effusion or pneumothorax. Degenerative changes of the thoracic spine. No acute osseous abnormality. IMPRESSION: Streaky opacities at the right lung base are suspicious for pneumonia. Followup PA and lateral chest X-ray is recommended in 3-4 weeks following trial of antibiotic therapy to ensure resolution. Electronically Signed   By: Mannie Seek M.D.   On: 12/27/2023 09:39    Procedures Procedures (including critical care time)  Medications Ordered in UC Medications - No data to display  Initial Impression / Assessment and Plan / UC Course  I have reviewed the triage vital signs and the nursing notes.  Pertinent labs & imaging results that were available during my care of the patient were reviewed by me and considered in my medical decision making (see chart for details).     Pneumonia of right lower lobe due to infectious organism  Shortness of breath - Plan: Comprehensive metabolic panel, CBC with Differential/Platelet, DG Chest 2 View, Comprehensive metabolic panel, CBC with Differential/Platelet, DG Chest 2 View   X-ray done today of the chest.  This did show a right lower lobe pneumonia.  This is consistent with the persistent symptoms.  Urinalysis done today did not show an infectious process.  We have also sent off a complete blood count as well as a complete metabolic panel.  These results will take 24 to 48 hours.  If any of these results need further  treatment, we will contact you otherwise the results will be available on your MyChart.  For the pneumonia, we will treat with the following: Azithromycin 500 mg (2 tablets) daily for 5 days.  This is an antibiotic.  Take this with food. Prednisone 40 mg (2 tablets) once daily for 5 days. Take this in the morning.  This is a steroid to help with inflammation and pain.  Rest when needed and stay hydrated. Per radiologist recommendation, return in 3 to 4 weeks for a repeat chest x-ray to ensure that this is resolving. Return to urgent care if you feel symptoms are not resolving or feel that symptoms are worsening.  Final Clinical Impressions(s) / UC Diagnoses   Final diagnoses:  Shortness of breath  Pneumonia of right lower lobe due to infectious organism     Discharge Instructions      X-ray done today of the chest.  This did show a right lower lobe pneumonia.  This is consistent with the persistent symptoms.  Urinalysis done today did not show an infectious process.  We have also sent off a complete blood count as well as a complete metabolic panel.  These results will take 24 to 48 hours.  If any of these results need further treatment, we will contact you otherwise the results will be available on your MyChart.  For the pneumonia, we will treat with the following: Azithromycin 500 mg (2 tablets) daily for 5 days.  This is an antibiotic.  Take this with food. Prednisone 40 mg (2 tablets) once daily for 5 days. Take this in the morning.  This is  a steroid to help with inflammation and pain.  Rest when needed and stay hydrated. Per radiologist recommendation, return in 3 to 4 weeks for a repeat chest x-ray to ensure that this is resolving. Return to urgent care if you feel symptoms are not resolving or feel that symptoms are worsening.     ED Prescriptions     Medication Sig Dispense Auth. Provider   azithromycin (ZITHROMAX) 250 MG tablet Take 2 tablets (500 mg total) by mouth daily for  5 days. Take first 2 tablets together, then 1 every day until finished. 10 tablet Kavish Lafitte A, PA-C   predniSONE (DELTASONE) 20 MG tablet Take 2 tablets (40 mg total) by mouth daily with breakfast for 5 days. 10 tablet Kreg Pesa, [place]      PDMP not reviewed this encounter.   Kreg Pesa, [place] 12/27/23 [phone]

## 2023-12-28 ENCOUNTER — Ambulatory Visit (HOSPITAL_COMMUNITY): Payer: Self-pay

## 2023-12-28 LAB — COMPREHENSIVE METABOLIC PANEL WITH GFR
ALT: 30 IU/L (ref 0–44)
AST: 30 IU/L (ref 0–40)
Albumin: 3.8 g/dL — ABNORMAL LOW (ref 3.9–4.9)
Alkaline Phosphatase: 125 IU/L — ABNORMAL HIGH (ref 44–121)
BUN/Creatinine Ratio: 14 (ref 10–24)
BUN: 17 mg/dL (ref 8–27)
Bilirubin Total: 0.5 mg/dL (ref 0.0–1.2)
CO2: 21 mmol/L (ref 20–29)
Calcium: 8.7 mg/dL (ref 8.6–10.2)
Chloride: 101 mmol/L (ref 96–106)
Creatinine, Ser: 1.25 mg/dL (ref 0.76–1.27)
Globulin, Total: 2.4 g/dL (ref 1.5–4.5)
Glucose: 102 mg/dL — ABNORMAL HIGH (ref 70–99)
Potassium: 4.3 mmol/L (ref 3.5–5.2)
Sodium: 135 mmol/L (ref 134–144)
Total Protein: 6.2 g/dL (ref 6.0–8.5)
eGFR: 64 mL/min/{1.73_m2} (ref >59)

## 2023-12-28 LAB — CBC WITH DIFFERENTIAL/PLATELET
Basophils Absolute: 0 10*3/uL (ref 0.0–0.2)
Basos: 0 %
EOS (ABSOLUTE): 0.1 10*3/uL (ref 0.0–0.4)
Eos: 0 %
Hematocrit: 40.3 % (ref 37.5–51.0)
Hemoglobin: 13.5 g/dL (ref 13.0–17.7)
Immature Grans (Abs): 0.1 10*3/uL (ref 0.0–0.1)
Immature Granulocytes: 1 %
Lymphocytes Absolute: 1.5 10*3/uL (ref 0.7–3.1)
Lymphs: 10 %
MCH: 32.1 pg (ref 26.6–33.0)
MCHC: 33.5 g/dL (ref 31.5–35.7)
MCV: 96 fL (ref 79–97)
Monocytes Absolute: 0.8 10*3/uL (ref 0.1–0.9)
Monocytes: 6 %
Neutrophils Absolute: 11.6 10*3/uL — ABNORMAL HIGH (ref 1.4–7.0)
Neutrophils: 83 %
Platelets: 206 10*3/uL (ref 150–450)
RBC: 4.21 x10E6/uL (ref 4.14–5.80)
RDW: 11.8 % (ref 11.6–15.4)
WBC: 14 10*3/uL — ABNORMAL HIGH (ref 3.4–10.8)
